# Patient Record
Sex: Female | Born: 1965 | Race: White | Hispanic: No | Marital: Married | Smoking: Never smoker
Health system: Southern US, Community
[De-identification: ages and names within clinical notes are randomized; demographics above are authoritative.]

---

## 1998-04-08 ENCOUNTER — Other Ambulatory Visit: Admission: RE | Admit: 1998-04-08 | Discharge: 1998-04-08 | Payer: Self-pay | Admitting: Obstetrics & Gynecology

## 2002-02-16 ENCOUNTER — Other Ambulatory Visit: Admission: RE | Admit: 2002-02-16 | Discharge: 2002-02-16 | Payer: Self-pay | Admitting: Obstetrics & Gynecology

## 2004-12-06 ENCOUNTER — Other Ambulatory Visit: Admission: RE | Admit: 2004-12-06 | Discharge: 2004-12-06 | Payer: Self-pay | Admitting: Obstetrics and Gynecology

## 2012-07-06 ENCOUNTER — Encounter (HOSPITAL_COMMUNITY): Payer: Self-pay | Admitting: *Deleted

## 2012-07-06 ENCOUNTER — Emergency Department (HOSPITAL_COMMUNITY)
Admission: EM | Admit: 2012-07-06 | Discharge: 2012-07-07 | Disposition: A | Discharge status: Home / Self Care | Payer: PRIVATE HEALTH INSURANCE | Attending: Emergency Medicine | Admitting: Emergency Medicine

## 2012-07-06 DIAGNOSIS — R000 Tachycardia, unspecified: Secondary | ICD-10-CM | POA: Insufficient documentation

## 2012-07-06 DIAGNOSIS — J329 Chronic sinusitis, unspecified: Secondary | ICD-10-CM | POA: Insufficient documentation

## 2012-07-06 DIAGNOSIS — R05 Cough: Secondary | ICD-10-CM | POA: Insufficient documentation

## 2012-07-06 DIAGNOSIS — J3489 Other specified disorders of nose and nasal sinuses: Secondary | ICD-10-CM | POA: Insufficient documentation

## 2012-07-06 DIAGNOSIS — R51 Headache: Secondary | ICD-10-CM | POA: Insufficient documentation

## 2012-07-06 DIAGNOSIS — R059 Cough, unspecified: Secondary | ICD-10-CM | POA: Insufficient documentation

## 2012-07-06 DIAGNOSIS — R509 Fever, unspecified: Secondary | ICD-10-CM | POA: Insufficient documentation

## 2012-07-06 LAB — RAPID STREP SCREEN (MED CTR MEBANE ONLY): Streptococcus, Group A Screen (Direct): NEGATIVE

## 2012-07-06 MED ORDER — PSEUDOEPHEDRINE HCL ER 120 MG PO TB12
120.0000 mg | ORAL_TABLET | Freq: Two times a day (BID) | ORAL | Status: DC
  Administered 2012-07-06: 120 mg via ORAL
  Filled 2012-07-06: qty 1

## 2012-07-06 MED ORDER — AMOXICILLIN-POT CLAVULANATE 875-125 MG PO TABS
1.0000 | ORAL_TABLET | Freq: Once | ORAL | Status: AC
  Administered 2012-07-06: 1 via ORAL
  Filled 2012-07-06: qty 1

## 2012-07-06 MED ORDER — PSEUDOEPHEDRINE HCL ER 120 MG PO TB12: 120.0000 mg | ORAL_TABLET | Freq: Two times a day (BID) | ORAL | Status: DC

## 2012-07-06 MED ORDER — ACETAMINOPHEN 325 MG PO TABS
650.0000 mg | ORAL_TABLET | Freq: Once | ORAL | Status: AC
  Administered 2012-07-06: 650 mg via ORAL
  Filled 2012-07-06: qty 2

## 2012-07-06 MED ORDER — AMOXICILLIN-POT CLAVULANATE 875-125 MG PO TABS: 1.0000 | ORAL_TABLET | Freq: Two times a day (BID) | ORAL | Status: DC

## 2012-07-06 NOTE — ED Provider Notes (Signed)
History     CSN: [id number]  Arrival date & time 07/06/12  2035   First MD Initiated Contact with Patient 07/06/12 2252      Chief Complaint  Patient presents with  . Sore Throat    (Consider location/radiation/quality/duration/timing/severity/associated sxs/prior treatment) HPI Comments: Patient has ben ill since Thursday 4 days ago started with headache, than nasal congestion and facial pressure, now with increased sore throat, non productive cough fever to 101.4   Patient is a 47 y.o. female presenting with pharyngitis.  Sore Throat Associated symptoms include congestion, coughing, a fever, headaches and a sore throat. Pertinent negatives include no chest pain, nausea, neck pain, rash, vertigo or vomiting. The symptoms are aggravated by swallowing. She has tried acetaminophen for the symptoms. The treatment provided mild relief.    History reviewed. No pertinent past medical history.  Past Surgical History  Procedure Laterality Date  . Cesarean section      History reviewed. No pertinent family history.  History  Substance Use Topics  . Smoking status: Never Smoker   . Smokeless tobacco: Not on file  . Alcohol Use: No    OB History   Grav Para Term Preterm Abortions TAB SAB Ect Mult Living                  Review of Systems  Constitutional: Positive for fever.  HENT: Positive for congestion and sore throat. Negative for neck pain.   Eyes: Negative for photophobia and visual disturbance.  Respiratory: Positive for cough. Negative for shortness of breath and wheezing.   Cardiovascular: Negative for chest pain.  Gastrointestinal: Negative for nausea and vomiting.  Skin: Negative for rash.  Neurological: Positive for headaches. Negative for vertigo.  All other systems reviewed and are negative.    Allergies  Review of patient's allergies indicates no known allergies.  Home Medications   Current Outpatient Rx  Name  Route  Sig  Dispense  Refill  .  amoxicillin-clavulanate (AUGMENTIN) 875-125 MG per tablet   Oral   Take 1 tablet by mouth 2 (two) times daily.   13 tablet   0   . pseudoephedrine (SUDAFED) 120 MG 12 hr tablet   Oral   Take 1 tablet (120 mg total) by mouth every 12 (twelve) hours.   13 tablet   0     BP 156/91  Pulse 119  Temp(Src) 101.4 F (38.6 C) (Oral)  Resp 16  SpO2 97%  Physical Exam  Constitutional: She is oriented to person, place, and time. She appears well-nourished.  HENT:  Head: Normocephalic and atraumatic.  Right Ear: Tympanic membrane, external ear and ear canal normal.  Left Ear: Tympanic membrane, external ear and ear canal normal.  Nose: Rhinorrhea present. Right sinus exhibits maxillary sinus tenderness and frontal sinus tenderness. Left sinus exhibits maxillary sinus tenderness and frontal sinus tenderness.  Mouth/Throat: Oropharynx is clear and moist.  Cardiovascular: Tachycardia present.   Pulmonary/Chest: Effort normal and breath sounds normal.  Musculoskeletal: Normal range of motion.  Lymphadenopathy:    She has no cervical adenopathy.  Neurological: She is alert and oriented to person, place, and time.  Skin: Skin is warm and dry. No erythema.    ED Course  Procedures (including critical care time)  Labs Reviewed  RAPID STREP SCREEN   No results found.   1. Sinusitis       MDM  Strep negative  + sinus pressure , postnasal drip  Will treat with Augmentin and Sudafed  Arman Filter, NP 07/06/12 [phone]

## 2012-07-06 NOTE — ED Notes (Signed)
Pt states that she started with a sore throat on Thursday. Pt states Friday she also had chills and a fever, pt states that over the weekend she was having body aches and taking alcaselzer and otc cold medicine, but her throat did not get better.

## 2012-07-06 NOTE — ED Provider Notes (Signed)
Medical screening examination/treatment/procedure(s) were performed by non-physician practitioner and as supervising physician I was immediately available for consultation/collaboration.   Gwyneth Sprout, MD 07/06/12 [phone]

## 2014-11-08 ENCOUNTER — Encounter: Payer: Self-pay | Admitting: Neurology

## 2014-11-08 ENCOUNTER — Ambulatory Visit (INDEPENDENT_AMBULATORY_CARE_PROVIDER_SITE_OTHER): Payer: PRIVATE HEALTH INSURANCE | Admitting: Neurology

## 2014-11-08 VITALS — BP 151/90 | HR 92 | Temp 97.8°F | Ht 61.0 in | Wt 175.2 lb

## 2014-11-08 DIAGNOSIS — G5602 Carpal tunnel syndrome, left upper limb: Secondary | ICD-10-CM | POA: Diagnosis not present

## 2014-11-08 DIAGNOSIS — M5010 Cervical disc disorder with radiculopathy, unspecified cervical region: Secondary | ICD-10-CM

## 2014-11-08 DIAGNOSIS — R200 Anesthesia of skin: Secondary | ICD-10-CM | POA: Diagnosis not present

## 2014-11-08 DIAGNOSIS — R208 Other disturbances of skin sensation: Secondary | ICD-10-CM

## 2014-11-08 DIAGNOSIS — G5601 Carpal tunnel syndrome, right upper limb: Secondary | ICD-10-CM | POA: Diagnosis not present

## 2014-11-08 DIAGNOSIS — M5481 Occipital neuralgia: Secondary | ICD-10-CM | POA: Diagnosis not present

## 2014-11-08 DIAGNOSIS — R29898 Other symptoms and signs involving the musculoskeletal system: Secondary | ICD-10-CM | POA: Insufficient documentation

## 2014-11-08 DIAGNOSIS — M542 Cervicalgia: Secondary | ICD-10-CM | POA: Insufficient documentation

## 2014-11-08 DIAGNOSIS — G5600 Carpal tunnel syndrome, unspecified upper limb: Secondary | ICD-10-CM | POA: Insufficient documentation

## 2014-11-08 DIAGNOSIS — G5603 Carpal tunnel syndrome, bilateral upper limbs: Secondary | ICD-10-CM

## 2014-11-08 MED ORDER — NORTRIPTYLINE HCL 10 MG PO CAPS: 20.0000 mg | ORAL_CAPSULE | Freq: Every day | ORAL | Status: AC

## 2014-11-08 MED ORDER — CYCLOBENZAPRINE HCL 5 MG PO TABS: 5.0000 mg | ORAL_TABLET | Freq: Three times a day (TID) | ORAL | Status: AC | PRN

## 2014-11-08 NOTE — Progress Notes (Signed)
[facility]    Provider:  Dr Lucia Gaskins Referring Provider: Delorse Lek, MD Primary Care Physician:  Delorse Lek, MD  CC:  numbness  HPI:  Gina Leonard is a 49 y.o. female here as a referral from Dr. Doristine Counter for numbness throughout body.She has no significant PMHx.   She is having some numbness in her hands. She wakes up with numbness in the mornings. She uses her hands a lot. Her head is bent down a lot due to work. 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 Symptoms started with numbness and is worsening. Started 6 months ago or more. She is also having neck pain now. She is having numbness radiating into the back of the scalp to the occipital area. Her fingers go numb except the pinkys. She is also getting some radiating numbness into her pinkys if she rests her elbow on the table. The neck hurts all the time, tightness. No inciting factors for the neck pain but she had lancinating pain into the occipital area. She has some numbness in the right side of the face. She has shooting pain in the occipital area, lightning, severe, scares her, thought she was having a stroke. Happened at least 3 times with some radiation into the jawline. She has tenderness in the occipital area to touch. She has blurry vision. And Headache. She has neck pain in the middle of the neck. tendeness to palpation and tightness. She has some weakness in the arms. Symptoms all started suddenly. She is concerned because she never really had any problems before, she has been healthy. No triggers. No alleviating factors.  Review of Systems: Patient complains of symptoms per HPI as well as the following symptoms: Fatigue, blurred vision, aching muscles, moles, headache, sleepiness. Pertinent negatives per HPI. All others negative.   History   Social History  . Marital Status: Married    Spouse Name: August Saucer  . Number of  Children: 3  . Years of Education: 12   Occupational History  . [employer]    Social History Main Topics  . Smoking status: Never Smoker   . Smokeless tobacco: Not on file  . Alcohol Use: No  . Drug Use: No  . Sexual Activity: Not on file   Other Topics Concern  . Not on file   Social History Narrative   Lives at home with husband, August Saucer.   Caffeine use: 1-2 sodas per day   Herbal tea- every morning    Family History  Problem Relation Age of Onset  . Leukemia Paternal Grandfather   . Dementia Paternal Grandmother   . Cancer Maternal Grandmother   . Heart Problems Paternal Grandfather     History reviewed. No pertinent past medical history.  Past Surgical History  Procedure Laterality Date  . Cesarean section  [id number]    x2    Current Outpatient Prescriptions  Medication Sig Dispense Refill  . cyclobenzaprine (FLEXERIL) 5 MG tablet Take 1 tablet (5 mg total) by mouth every 8 (eight) hours as needed for muscle spasms. 90 tablet 1  . nortriptyline (PAMELOR) 10 MG capsule Take 2 capsules (20 mg total) by mouth at bedtime. Can increase to  if needed 60 capsule 3   No current facility-administered medications for this visit.    Allergies as of 11/08/2014  . (No Known Allergies)    Vitals: BP 151/90 mmHg  Pulse 92  Temp(Src) 97.8  F (36.6 C) (Oral)  Ht 5\' 1"  (1.549 m)  Wt 175 lb 3.2 oz (79.47 kg)  BMI 33.12 kg/m2 Last Weight:  Wt Readings from Last 1 Encounters:  11/09/14 175 lb (79.379 kg)   Last Height:   Ht Readings from Last 1 Encounters:  11/08/14 5\' 1"  (1.549 m)   Physical exam: Exam: Gen: NAD, conversant, well nourised, obese, well groomed                     CV: RRR, no MRG. No Carotid Bruits. No peripheral edema, warm, nontender Eyes: Conjunctivae clear without exudates or hemorrhage  Neuro: Detailed Neurologic Exam  Speech:    Speech is normal; fluent and spontaneous with normal comprehension.  Cognition:    The patient is  oriented to person, place, and time;     recent and remote memory intact;     language fluent;     normal attention, concentration,     fund of knowledge Cranial Nerves:    The pupils are equal, round, and reactive to light. The fundi are normal and spontaneous venous pulsations are present. Visual fields are full to finger confrontation. Extraocular movements are intact. Trigeminal sensation is intact and the muscles of mastication are normal. The face is symmetric. The palate elevates in the midline. Hearing intact. Voice is normal. Shoulder shrug is normal. The tongue has normal motion without fasciculations.   Coordination:    Normal finger to nose and heel to shin. Normal rapid alternating movements.   Gait:    Heel-toe and tandem gait are normal.   Motor Observation:    No asymmetry, no atrophy, and no involuntary movements noted. Tone:    Normal muscle tone.    Posture:    Posture is normal. normal erect    Strength:    Strength is V/V in the upper and lower limbs.      Sensation: intact to LT     Reflex Exam:  DTR's:    Deep tendon reflexes in the upper and lower extremities are normal bilaterally.   Toes:    The toes are downgoing bilaterally.   Clonus:    Clonus is absent.    Assessment/Plan:  49 year old female with multiple complaints that are progressive including numbness in the hands, numbness and pain in the neck, occipital neuralgia, pain that radiates into the face, blurry vision, arm weakness, fatigue, aching muscles, headaches. Recommend ambulatory referral to physical therapy for what appears to be musculoskeletal neck pain. We'll order labs to include a CMP and Lyme antibody. MRI of the brain and MRI of the cervical spine. Nerve conduction studies with EMG on the upper extremities to evaluate for CTS versus cervical radiculopathy.  Naomie DeanAntonia Irelynd Zumstein, MD  [facility] [address] [place], [place] [id number]  Phone  [phone] Fax [phone]

## 2014-11-08 NOTE — Patient Instructions (Signed)
Overall you are doing well but I do want to suggest a few things today:   Remember to drink plenty of fluid, eat healthy meals and do not skip any meals. Try to eat protein with a every meal and eat a healthy snack such as fruit or nuts in between meals. Try to keep a regular sleep-wake schedule and try to exercise daily, particularly in the form of walking, 20-30 minutes a day, if you can.   As far as your medications are concerned, I would like to suggest: Flexeril - 5mg  up to 3x a day Nortriptyline for neck pain, occipital neuralgia and will also help you sleep  As far as diagnostic testing: MRi of the brain and neck, emg/ncs for CTS  I would like to see you back in a few weeks for emg/ncs, sooner if we need to. Please call us with any interim questions, concerns, problems, updates or refill requests.   Please also call us for any test results so we can go over those with you on the phone.  My clinical assistant and will answer any of your questions and relay your messages to me and also relay most of my messages to you.   Our phone number is [phone]. We also have an after hours call service for urgent matters and there is a physician on-call for urgent questions. For any emergencies you know to call 911 or go to the nearest emergency room

## 2014-11-09 LAB — COMPREHENSIVE METABOLIC PANEL
A/G RATIO: 1.8 (ref 1.1–2.5)
ALK PHOS: 62 IU/L (ref 39–117)
ALT: 10 IU/L (ref 0–32)
AST: 17 IU/L (ref 0–40)
Albumin: 4.4 g/dL (ref 3.5–5.5)
BUN / CREAT RATIO: 18 (ref 9–23)
BUN: 10 mg/dL (ref 6–24)
Bilirubin Total: <0.2 mg/dL (ref 0.0–1.2)
CO2: 24 mmol/L (ref 18–29)
Calcium: 9.3 mg/dL (ref 8.7–10.2)
Chloride: 98 mmol/L (ref 97–108)
Creatinine, Ser: 0.56 mg/dL — ABNORMAL LOW (ref 0.57–1.00)
GFR, EST AFRICAN AMERICAN: 128 mL/min/{1.73_m2} (ref >59)
GFR, EST NON AFRICAN AMERICAN: 111 mL/min/{1.73_m2} (ref >59)
GLUCOSE: 102 mg/dL — AB (ref 65–99)
Globulin, Total: 2.5 g/dL (ref 1.5–4.5)
Potassium: 4.5 mmol/L (ref 3.5–5.2)
SODIUM: 136 mmol/L (ref 134–144)
TOTAL PROTEIN: 6.9 g/dL (ref 6.0–8.5)

## 2014-11-09 LAB — B. BURGDORFI ANTIBODIES: Lyme IgG/IgM Ab: <0.91 {ISR} (ref 0.00–0.90)

## 2014-11-10 ENCOUNTER — Telehealth: Payer: Self-pay | Admitting: *Deleted

## 2014-11-10 ENCOUNTER — Ambulatory Visit (INDEPENDENT_AMBULATORY_CARE_PROVIDER_SITE_OTHER): Payer: PRIVATE HEALTH INSURANCE

## 2014-11-10 DIAGNOSIS — R29898 Other symptoms and signs involving the musculoskeletal system: Secondary | ICD-10-CM

## 2014-11-10 DIAGNOSIS — R200 Anesthesia of skin: Secondary | ICD-10-CM

## 2014-11-10 DIAGNOSIS — M542 Cervicalgia: Secondary | ICD-10-CM

## 2014-11-10 DIAGNOSIS — M5481 Occipital neuralgia: Secondary | ICD-10-CM

## 2014-11-10 DIAGNOSIS — R208 Other disturbances of skin sensation: Secondary | ICD-10-CM | POA: Diagnosis not present

## 2014-11-10 DIAGNOSIS — M5010 Cervical disc disorder with radiculopathy, unspecified cervical region: Secondary | ICD-10-CM

## 2014-11-10 NOTE — Telephone Encounter (Signed)
Left detailed VM about unremarkable labs including negative lyme test. Gave [facility]  Phone number and office hours. Dr. Lucia GaskinsAhern and I not in office on Friday's. Told her to call with further questions if needed.

## 2014-11-12 ENCOUNTER — Encounter: Payer: Self-pay | Admitting: Neurology

## 2014-11-14 ENCOUNTER — Telehealth: Payer: Self-pay | Admitting: Neurology

## 2014-11-14 NOTE — Telephone Encounter (Signed)
Called patient. Left a message, MRI of the brain was normal. MRI of the cervical spine showed some degenerative changes. Nothing to explain all her symptoms. Can discuss at her emg/ncs or they can call me back thanks to discuss

## 2014-11-25 ENCOUNTER — Encounter: Payer: PRIVATE HEALTH INSURANCE | Admitting: Neurology

## 2016-08-28 ENCOUNTER — Other Ambulatory Visit: Payer: Self-pay | Admitting: Urology

## 2016-08-28 DIAGNOSIS — N6310 Unspecified lump in the right breast, unspecified quadrant: Secondary | ICD-10-CM

## 2016-08-29 ENCOUNTER — Other Ambulatory Visit: Payer: Self-pay | Admitting: Obstetrics and Gynecology

## 2016-08-29 DIAGNOSIS — N6310 Unspecified lump in the right breast, unspecified quadrant: Secondary | ICD-10-CM

## 2016-08-31 ENCOUNTER — Ambulatory Visit
Admission: RE | Admit: 2016-08-31 | Discharge: 2016-08-31 | Disposition: A | Discharge status: Home / Self Care | Payer: 59 | Source: Ambulatory Visit | Attending: Urology | Admitting: Urology

## 2016-08-31 DIAGNOSIS — N6310 Unspecified lump in the right breast, unspecified quadrant: Secondary | ICD-10-CM

## 2016-08-31 IMAGING — MG 2D DIGITAL DIAGNOSTIC UNILATERAL RIGHT MAMMOGRAM WITH CAD AND AD
6 of 9 series · 6 of 21 positions shown · non-contrast
Comparison: Previous exam(s).

CLINICAL DATA: Patient developed an area of soreness over her upper
inner right breast. She subsequently developed an area of skin
irritation, for which she is on antibiotic ointment. The underlying
area soreness has improved. Patient admits to somewhat excessive
self-examination of this area which likely accounts the skin
irritation.

EXAM:
2D DIGITAL DIAGNOSTIC RIGHT MAMMOGRAM WITH CAD AND ADJUNCT TOMO
ULTRASOUND RIGHT BREAST

[R MLO]
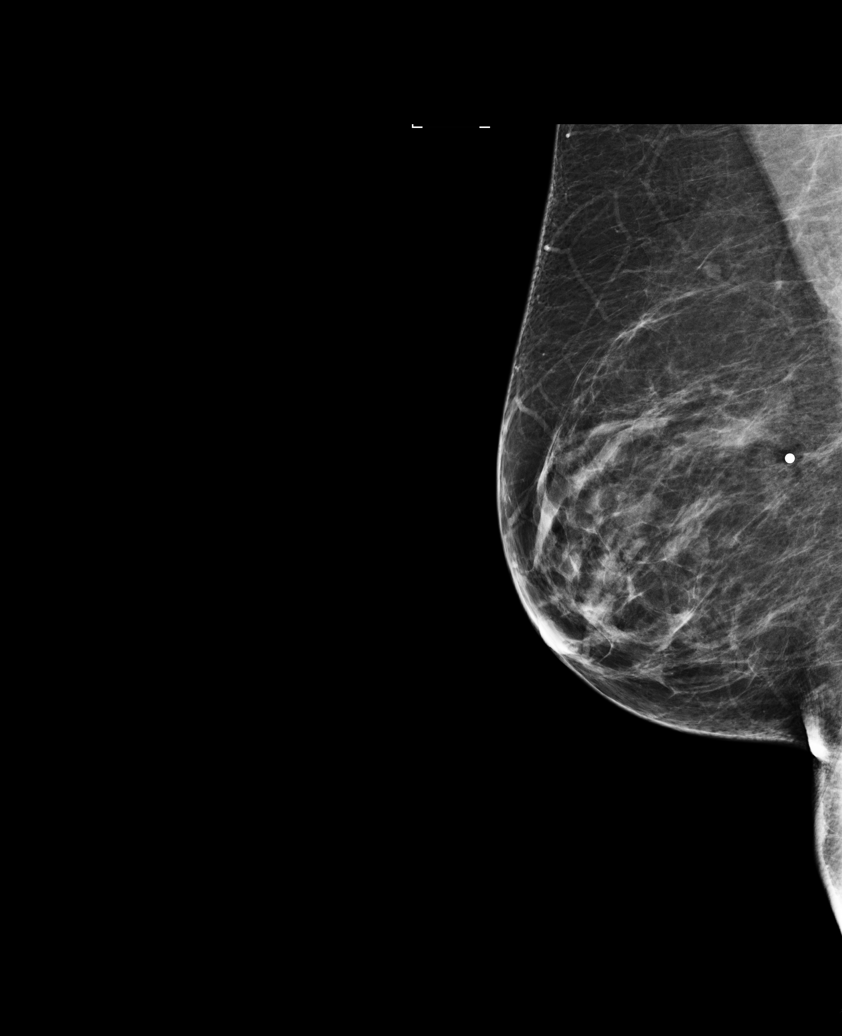

[R CC synth-2D]
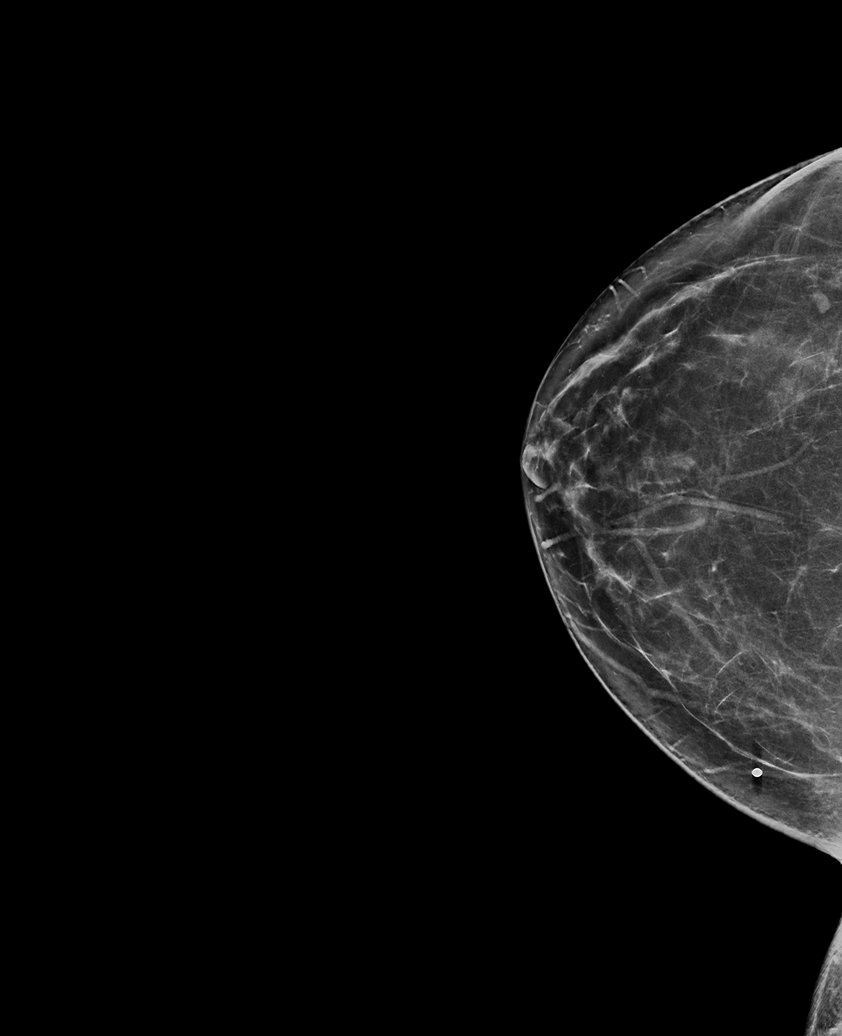

[R TAN synth-2D]
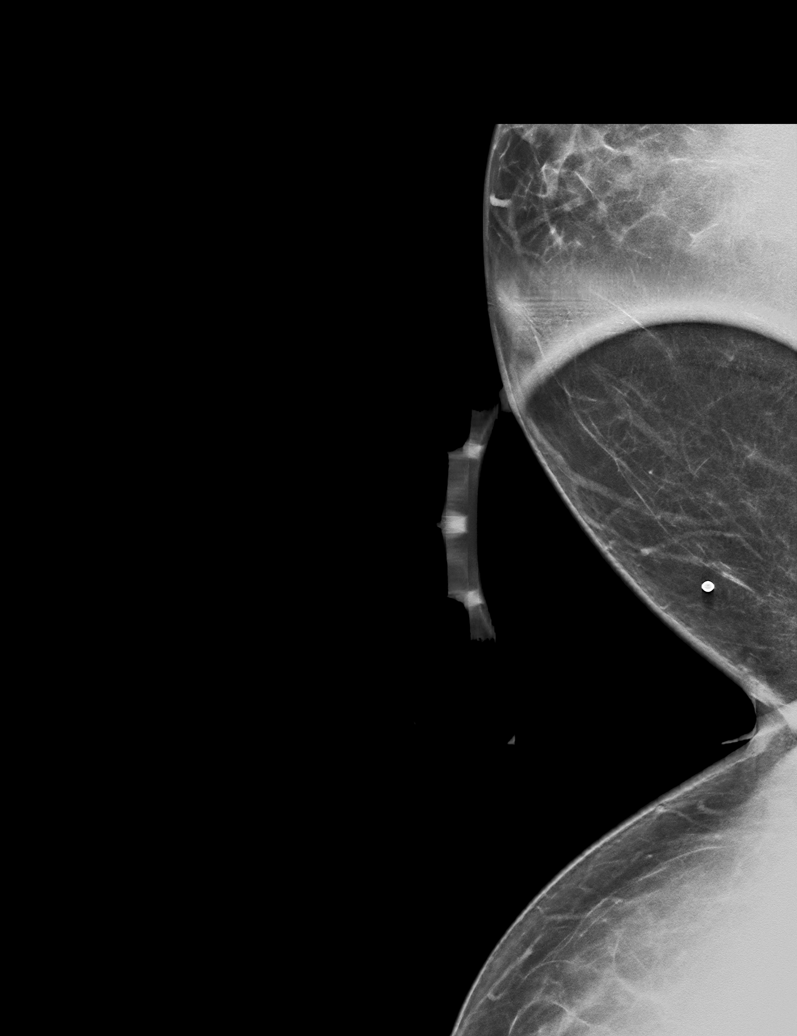

[R MLO synth-2D]
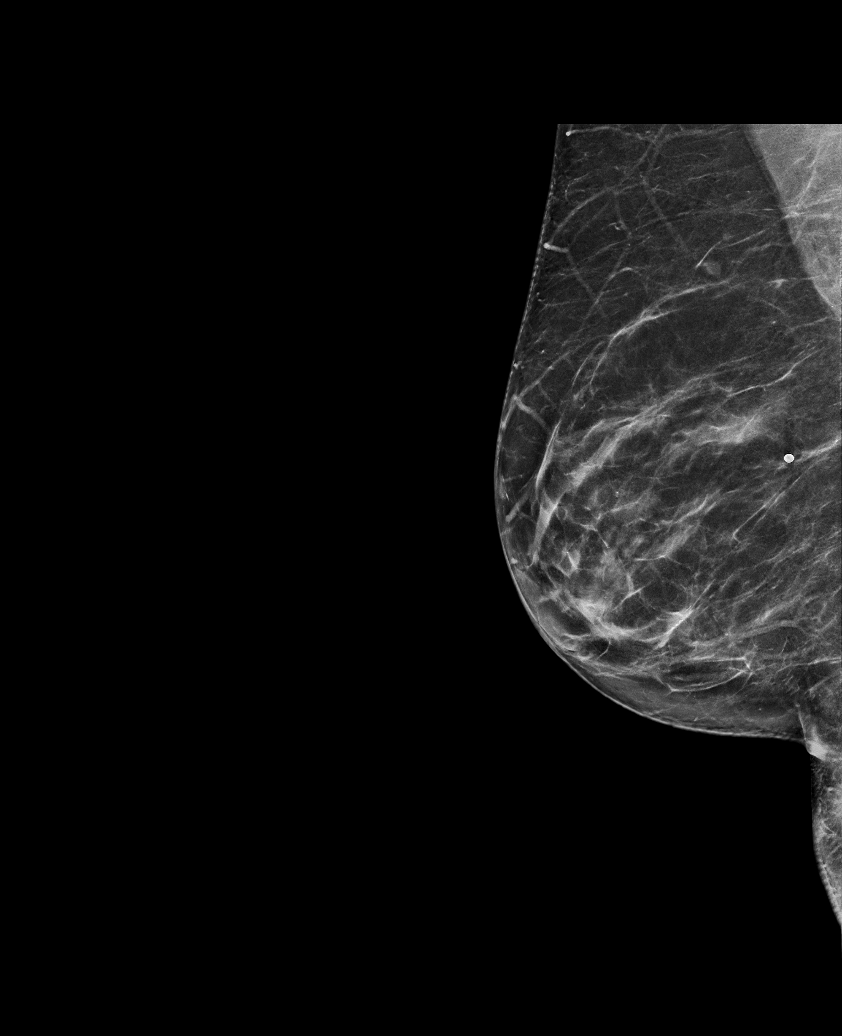

[R CC]
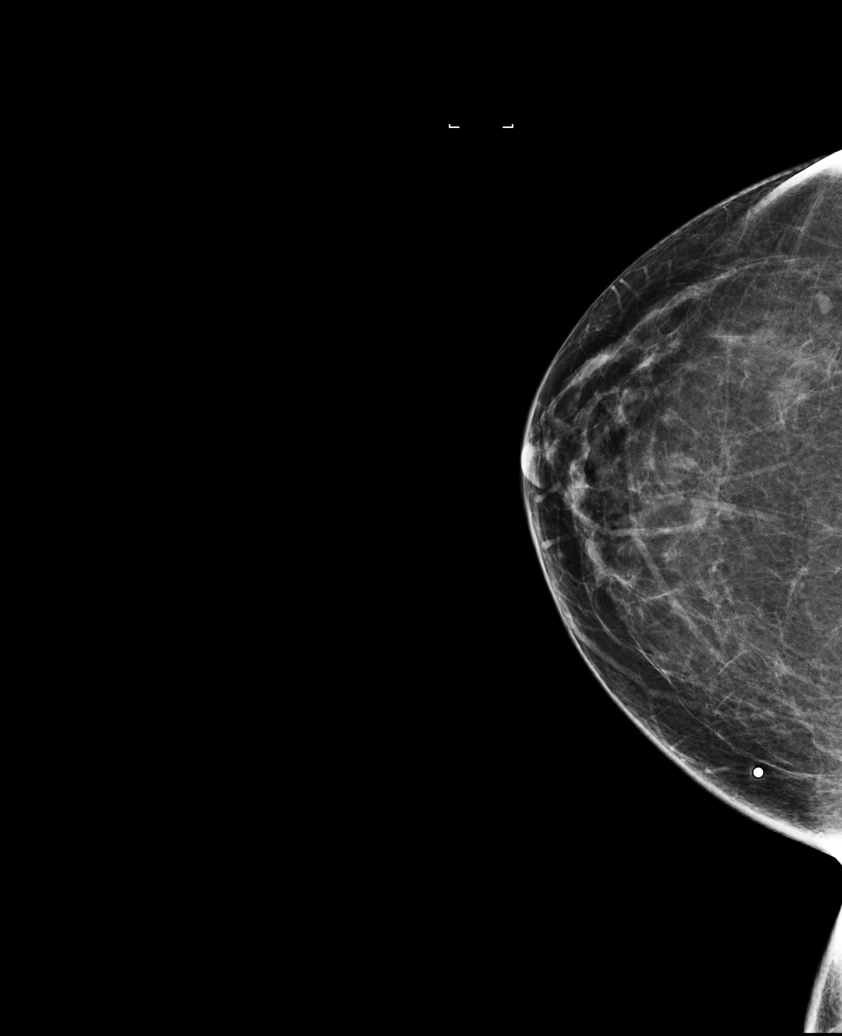

[R TAN]
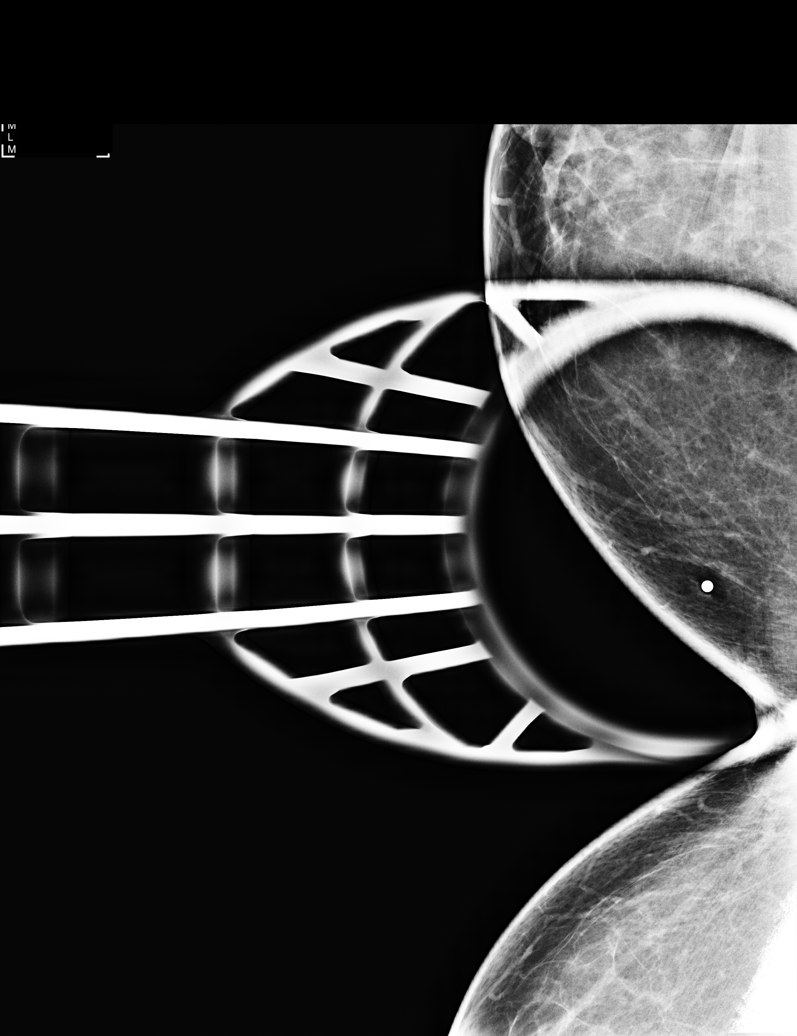

[6 of 21 positions shown; findings below may reference images not displayed]

ACR Breast Density Category b: There are scattered areas of
fibroglandular density.
FINDINGS: There are no discrete masses, areas of architectural distortion,
areas of significant asymmetry or suspicious calcifications. No
mammographic change.

Mammographic images were processed with CAD.

On physical exam, there is a round, approximately 3-4 cm, area of
epidermal scan loss and irritation, with no underlying mass.

Targeted ultrasound is performed, showing normal fibroglandular
tissue throughout the upper inner right breast. No mass or cyst.
IMPRESSION: 1. No evidence of breast malignancy.
2. Skin irritation, possibly infection, of the upper inner region of
the right breast, currently under treatment.

RECOMMENDATION:
Screening mammogram per normal screening schedule.  Code:[8C])

I have discussed the findings and recommendations with the patient.
Results were also provided in writing at the conclusion of the
visit. If applicable, a reminder letter will be sent to the patient
regarding the next appointment.

BI-RADS CATEGORY  1: Negative.

## 2016-09-20 ENCOUNTER — Other Ambulatory Visit: Payer: Self-pay | Admitting: Obstetrics and Gynecology

## 2016-09-25 LAB — CYTOLOGY - PAP
